# Patient Record
Sex: Male | Born: 1990 | Race: White | Hispanic: No | Marital: Single | Smoking: Never smoker
Health system: Southern US, Community
[De-identification: ages and names within clinical notes are randomized; demographics above are authoritative.]

## PROBLEM LIST (undated history)

## (undated) HISTORY — PX: HERNIA REPAIR: SHX51

---

## 2019-03-05 ENCOUNTER — Emergency Department (HOSPITAL_COMMUNITY): Payer: 59

## 2019-03-05 ENCOUNTER — Emergency Department (HOSPITAL_COMMUNITY)
Admission: EM | Admit: 2019-03-05 | Discharge: 2019-03-05 | Disposition: A | Discharge status: Home / Self Care | Payer: 59 | Attending: Emergency Medicine | Admitting: Emergency Medicine

## 2019-03-05 ENCOUNTER — Other Ambulatory Visit: Payer: Self-pay

## 2019-03-05 ENCOUNTER — Encounter (HOSPITAL_COMMUNITY): Payer: Self-pay | Admitting: Emergency Medicine

## 2019-03-05 DIAGNOSIS — R42 Dizziness and giddiness: Secondary | ICD-10-CM | POA: Diagnosis present

## 2019-03-05 DIAGNOSIS — G43909 Migraine, unspecified, not intractable, without status migrainosus: Secondary | ICD-10-CM | POA: Insufficient documentation

## 2019-03-05 LAB — URINALYSIS, ROUTINE W REFLEX MICROSCOPIC
Bacteria, UA: NONE SEEN
Bilirubin Urine: NEGATIVE
Glucose, UA: NEGATIVE mg/dL
Ketones, ur: 5 mg/dL — AB
Leukocytes,Ua: NEGATIVE
Nitrite: NEGATIVE
Protein, ur: NEGATIVE mg/dL
Specific Gravity, Urine: 1.01 (ref 1.005–1.030)
pH: 6 (ref 5.0–8.0)

## 2019-03-05 LAB — CBC
HCT: 46.8 % (ref 39.0–52.0)
Hemoglobin: 15.5 g/dL (ref 13.0–17.0)
MCH: 29.7 pg (ref 26.0–34.0)
MCHC: 33.1 g/dL (ref 30.0–36.0)
MCV: 89.7 fL (ref 80.0–100.0)
Platelets: 368 10*3/uL (ref 150–400)
RBC: 5.22 MIL/uL (ref 4.22–5.81)
RDW: 12.2 % (ref 11.5–15.5)
WBC: 10.2 10*3/uL (ref 4.0–10.5)
nRBC: 0 % (ref 0.0–0.2)

## 2019-03-05 LAB — BASIC METABOLIC PANEL
Anion gap: 9 (ref 5–15)
BUN: 15 mg/dL (ref 6–20)
CO2: 28 mmol/L (ref 22–32)
Calcium: 9.9 mg/dL (ref 8.9–10.3)
Chloride: 102 mmol/L (ref 98–111)
Creatinine, Ser: 0.76 mg/dL (ref 0.61–1.24)
GFR calc Af Amer: >60 mL/min (ref >60)
GFR calc non Af Amer: >60 mL/min (ref >60)
Glucose, Bld: 126 mg/dL — ABNORMAL HIGH (ref 70–99)
Potassium: 3.9 mmol/L (ref 3.5–5.1)
Sodium: 139 mmol/L (ref 135–145)

## 2019-03-05 MED ORDER — METOCLOPRAMIDE HCL 5 MG/ML IJ SOLN
10.0000 mg | Freq: Once | INTRAMUSCULAR | Status: AC
  Administered 2019-03-05: 10 mg via INTRAMUSCULAR
  Filled 2019-03-05: qty 2

## 2019-03-05 MED ORDER — DIPHENHYDRAMINE HCL 25 MG PO CAPS
25.0000 mg | ORAL_CAPSULE | Freq: Once | ORAL | Status: AC
  Administered 2019-03-05: 25 mg via ORAL
  Filled 2019-03-05: qty 1

## 2019-03-05 MED ORDER — KETOROLAC TROMETHAMINE 60 MG/2ML IM SOLN
60.0000 mg | Freq: Once | INTRAMUSCULAR | Status: AC
  Administered 2019-03-05: 60 mg via INTRAMUSCULAR
  Filled 2019-03-05: qty 2

## 2019-03-05 NOTE — ED Notes (Signed)
PT refused to have blood drawn in the waiting area and would rather wait to have blood drawn when he gets a room in the back per phlemotomy.

## 2019-03-05 NOTE — ED Notes (Signed)
Evalee Jefferson NP in with pt at this time

## 2019-03-05 NOTE — ED Provider Notes (Signed)
[facility] EMERGENCY DEPARTMENT Provider Note   CSN: [id number] Arrival date & time: 03/05/19  1208     History   Chief Complaint Chief Complaint  Patient presents with  . Dizziness    HPI Samuel Clay is a 28 y.o. male with a history significant only for migraine headache presenting with atypical migraine which started around 9 AM today.  He describes typically his migraines start with blurred vision, then resolves then develops a moderate bilateral headache.  This morning around 9 he noted that his head felt "like it was in a fish bowl".  He describes his vision seemed like it had to catch up with his head as he turned his head from side to side.  This has improved, but he now has a band of pressure around his head, 2 out of 10 on the pain scale.  Since this occurred he has felt weak generally, denies focal weakness, but had a near syncopal event associated with a brief episode of palpitations.  He called EMS and reports his blood pressure and his CBG were normal.  He still has mild headache and states generally does not feel well.  He denies nausea or vomiting, no abdominal pain, no shortness of breath, cough, fevers, also no dysuria.  He does not feel lightheaded currently.  He has had no treatment prior to arrival.  He last ate breakfast at 6 AM.     The history is provided by the patient.    History reviewed. No pertinent past medical history.  There are no active problems to display for this patient.   Past Surgical History:  Procedure Laterality Date  . HERNIA REPAIR          Home Medications    Prior to Admission medications   Not on File    Family History Family History  Problem Relation Age of Onset  . Heart disease Mother     Social History Social History   Tobacco Use  . Smoking status: Never Smoker  . Smokeless tobacco: Current User    Types: Chew  Substance Use Topics  . Alcohol use: Yes  . Drug use: Never     Allergies   Penicillins   Review of Systems Review of Systems  Constitutional: Negative for chills and fever.  HENT: Negative for congestion and sore throat.   Eyes: Negative.   Respiratory: Negative for chest tightness and shortness of breath.   Cardiovascular: Positive for palpitations. Negative for chest pain.  Gastrointestinal: Negative for abdominal pain, nausea and vomiting.  Genitourinary: Negative.   Musculoskeletal: Negative for arthralgias, joint swelling and neck pain.  Skin: Negative.  Negative for rash and wound.  Neurological: Positive for light-headedness and headaches. Negative for dizziness, weakness and numbness.  Psychiatric/Behavioral: Negative.      Physical Exam Updated Vital Signs BP 120/81 (BP Location: Right Arm)   Pulse 74   Temp 97.9 F (36.6 C) (Temporal)   Resp 16   Ht 5\' 11"  (1.803 m)   Wt 61.2 kg   SpO2 100%   BMI 18.83 kg/m   Physical Exam Vitals signs and nursing note reviewed.  Constitutional:      Appearance: He is well-developed.  HENT:     Head: Normocephalic and atraumatic.  Eyes:     General: No visual field deficit.    Conjunctiva/sclera: Conjunctivae normal.  Neck:     Musculoskeletal: Normal range of motion.  Cardiovascular:     Rate and Rhythm: Normal rate and regular rhythm.  Heart sounds: Normal heart sounds.  Pulmonary:     Effort: Pulmonary effort is normal.     Breath sounds: Normal breath sounds. No wheezing.  Abdominal:     General: Bowel sounds are normal.     Palpations: Abdomen is soft.     Tenderness: There is no abdominal tenderness.  Musculoskeletal: Normal range of motion.  Skin:    General: Skin is warm and dry.  Neurological:     General: No focal deficit present.     Mental Status: He is alert and oriented to person, place, and time.     Cranial Nerves: Cranial nerves are intact. No cranial nerve deficit or dysarthria.     Coordination: Coordination normal. Heel to [facility] Test normal. Rapid alternating movements normal.      Gait: Gait normal.     Deep Tendon Reflexes:     Reflex Scores:      Bicep reflexes are 2+ on the right side and 2+ on the left side.      Patellar reflexes are 2+ on the right side and 2+ on the left side.     ED Treatments / Results  Labs (all labs ordered are listed, but only abnormal results are displayed) Labs Reviewed  BASIC METABOLIC PANEL  CBC  URINALYSIS, ROUTINE W REFLEX MICROSCOPIC    EKG None  Radiology Dg Chest 2 View  Result Date: 03/05/2019 CLINICAL DATA:  Chest pain EXAM: CHEST - 2 VIEW COMPARISON:  None. FINDINGS: Lungs are clear. Heart size and pulmonary vascularity are normal. No adenopathy. There is mild upper thoracic dextroscoliosis. No pneumothorax. IMPRESSION: No edema or consolidation. Electronically Signed   By: Bretta Bang III M.D.   On: 03/05/2019 13:03   Ct Head Wo Contrast  Result Date: 03/05/2019 CLINICAL DATA:  Atypical migraine with near syncope EXAM: CT HEAD WITHOUT CONTRAST TECHNIQUE: Contiguous axial images were obtained from the base of the skull through the vertex without intravenous contrast. COMPARISON:  None. FINDINGS: Brain: No evidence of acute infarction, hemorrhage, hydrocephalus, extra-axial collection or mass lesion/mass effect. Vascular: No hyperdense vessel or unexpected calcification. Skull: Normal. Negative for fracture or focal lesion. Sinuses/Orbits: No acute finding. Other: None. IMPRESSION: No acute intracranial pathology. No non-contrast CT findings to explain headache. Electronically Signed   By: Lauralyn Primes M.D.   On: 03/05/2019 17:00    Procedures Procedures (including critical care time)  Medications Ordered in ED Medications - No data to display   Initial Impression / Assessment and Plan / ED Course  I have reviewed the triage vital signs and the nursing notes.  Pertinent labs & imaging results that were available during my care of the patient were reviewed by me and considered in my medical decision  making (see chart for details).        Pt with mild headache preceeded by visual disturbance, followed by near syncopal event and fleeting episode of palpations.  Normal exam including neuro exam without focal deficit.  Labs pending, CT imaging, orthostatic VS.   Possible atypical migraine.    Discussed with Pauline Aus, PA-C who assumes pt care.   Final Clinical Impressions(s) / ED Diagnoses   Final diagnoses:  None    ED Discharge Orders    None       Victoriano Lain 03/05/19 1721    Glynn Octave, MD 03/05/19 2345

## 2019-03-05 NOTE — ED Provider Notes (Signed)
   Patient signed out to me by Evalee Jefferson, PA-C at end of shift.  Patient is 28 year old male who had episode of near syncope earlier today.  He also complained of diffuse bandlike headache and feeling as though he was "in a fish bowl."  No previous cardiac history.  He denies fever, neck pain or stiffness, and shortness of breath.    Labs Reviewed  BASIC METABOLIC PANEL - Abnormal; Notable for the following components:      Result Value   Glucose, Bld 126 (*)    All other components within normal limits  URINALYSIS, ROUTINE W REFLEX MICROSCOPIC - Abnormal; Notable for the following components:   Hgb urine dipstick SMALL (*)    Ketones, ur 5 (*)    All other components within normal limits  CBC       Dg Chest 2 View  Result Date: 03/05/2019 CLINICAL DATA:  Chest pain EXAM: CHEST - 2 VIEW COMPARISON:  None. FINDINGS: Lungs are clear. Heart size and pulmonary vascularity are normal. No adenopathy. There is mild upper thoracic dextroscoliosis. No pneumothorax. IMPRESSION: No edema or consolidation. Electronically Signed   By: Lowella Grip III M.D.   On: 03/05/2019 13:03   Ct Head Wo Contrast  Result Date: 03/05/2019 CLINICAL DATA:  Atypical migraine with near syncope EXAM: CT HEAD WITHOUT CONTRAST TECHNIQUE: Contiguous axial images were obtained from the base of the skull through the vertex without intravenous contrast. COMPARISON:  None. FINDINGS: Brain: No evidence of acute infarction, hemorrhage, hydrocephalus, extra-axial collection or mass lesion/mass effect. Vascular: No hyperdense vessel or unexpected calcification. Skull: Normal. Negative for fracture or focal lesion. Sinuses/Orbits: No acute finding. Other: None. IMPRESSION: No acute intracranial pathology. No non-contrast CT findings to explain headache. Electronically Signed   By: Eddie Candle M.D.   On: 03/05/2019 17:00      Work-up today has been reassuring.  Vital signs reviewed.  No orthostatic hypotension.   Symptoms felt to be related to atypical migraine.  No focal neuro deficits.  No vertiginous symptoms.  No meningeal signs.  Ambulatory with a steady gait.  On recheck, patient has drink oral fluids and was medicated with Toradol Benadryl and Reglan.  He reports feeling better and headache has resolved.  I feel he is appropriate for discharge home.  He agrees to close outpatient follow-up and request referral information for a headache specialist.  I feel this is appropriate.  Referral information provided.  Patient given strict return precautions.   Kem Parkinson, PA-C 03/05/19 1956    Ezequiel Essex, MD 03/05/19 2344

## 2019-03-05 NOTE — Discharge Instructions (Signed)
Drink plenty of fluids.  Follow-up with your primary care provider or return to the ER for any worsening symptoms.  I have also provided for all information for local neurologist that she may follow-up with regarding your headaches.

## 2019-03-05 NOTE — ED Triage Notes (Signed)
Patient states he has been having episodes of "feeling like I'm in a fish bowl" symptoms start after a sharp L sided chest pain. Patient states he has had periods where he felt like he was going to lose consciousness. No cardiac history. ECG done in Triage.

## 2019-03-21 ENCOUNTER — Encounter: Payer: Self-pay | Admitting: Emergency Medicine

## 2019-03-21 ENCOUNTER — Other Ambulatory Visit: Payer: Self-pay

## 2019-03-21 ENCOUNTER — Emergency Department
Admission: EM | Admit: 2019-03-21 | Discharge: 2019-03-21 | Disposition: A | Discharge status: Home / Self Care | Payer: 59 | Attending: Emergency Medicine | Admitting: Emergency Medicine

## 2019-03-21 ENCOUNTER — Emergency Department: Payer: 59

## 2019-03-21 DIAGNOSIS — R079 Chest pain, unspecified: Secondary | ICD-10-CM | POA: Insufficient documentation

## 2019-03-21 DIAGNOSIS — R0602 Shortness of breath: Secondary | ICD-10-CM | POA: Insufficient documentation

## 2019-03-21 DIAGNOSIS — F17220 Nicotine dependence, chewing tobacco, uncomplicated: Secondary | ICD-10-CM | POA: Diagnosis not present

## 2019-03-21 LAB — CBC
HCT: 44.7 % (ref 39.0–52.0)
Hemoglobin: 15 g/dL (ref 13.0–17.0)
MCH: 29.4 pg (ref 26.0–34.0)
MCHC: 33.6 g/dL (ref 30.0–36.0)
MCV: 87.5 fL (ref 80.0–100.0)
Platelets: 348 10*3/uL (ref 150–400)
RBC: 5.11 MIL/uL (ref 4.22–5.81)
RDW: 12.2 % (ref 11.5–15.5)
WBC: 6.3 10*3/uL (ref 4.0–10.5)
nRBC: 0 % (ref 0.0–0.2)

## 2019-03-21 LAB — TROPONIN I (HIGH SENSITIVITY)
Troponin I (High Sensitivity): 4 ng/L (ref <18)
Troponin I (High Sensitivity): <2 ng/L (ref <18)

## 2019-03-21 LAB — BASIC METABOLIC PANEL
Anion gap: 9 (ref 5–15)
BUN: 13 mg/dL (ref 6–20)
CO2: 27 mmol/L (ref 22–32)
Calcium: 9.8 mg/dL (ref 8.9–10.3)
Chloride: 102 mmol/L (ref 98–111)
Creatinine, Ser: 0.86 mg/dL (ref 0.61–1.24)
GFR calc Af Amer: >60 mL/min (ref >60)
GFR calc non Af Amer: >60 mL/min (ref >60)
Glucose, Bld: 91 mg/dL (ref 70–99)
Potassium: 4.2 mmol/L (ref 3.5–5.1)
Sodium: 138 mmol/L (ref 135–145)

## 2019-03-21 NOTE — ED Triage Notes (Signed)
Patient to ER for c/o chest pain. Patient reports being seen 2 weeks ago for chest pain and near syncope.

## 2019-03-21 NOTE — ED Provider Notes (Signed)
[facility] Emergency Department Provider Note  ____________________________________________   First MD Initiated Contact with Patient 03/21/19 [phone]     (approximate)  I have reviewed the triage vital signs and the nursing notes.   HISTORY  Chief Complaint Chest Pain    HPI Samuel Clay is a 28 y.o. male otherwise healthy who presents with chest pain.  Patient states he had chest pain that started this morning around 6:30 AM.  It is a sharp stabbing sensation in the middle of his chest, moderate, intermittent, nothing brings it on, nonexertional in nature, nothing makes it worse.  He endorses a little bit of shortness of breath with the chest pain comes on but then resolves. No leg swelling, prior clots, leg swelling, recent travel.  Denies any cough, fevers.  Denies any syncope.  Was seen here few weeks ago and had a near syncopal episode with a headache.  Denies any family history of cardiac issues.  Currently patient does not have any pain.  He states that he has had some intermittent pain in his chest for the past 1 week.    History reviewed. No pertinent past medical history.  There are no active problems to display for this patient.   Past Surgical History:  Procedure Laterality Date   HERNIA REPAIR      Prior to Admission medications   Not on File    Allergies Penicillins  Family History  Problem Relation Age of Onset   Heart disease Mother     Social History Social History   Tobacco Use   Smoking status: Never Smoker   Smokeless tobacco: Current User    Types: Chew  Substance Use Topics   Alcohol use: Yes   Drug use: Never      Review of Systems Constitutional: No fever/chills Eyes: No visual changes. ENT: No sore throat. Cardiovascular: Positive chest pain Respiratory: positive SOB Gastrointestinal: No abdominal pain.  No nausea, no vomiting.  No diarrhea.  No constipation. Genitourinary: Negative for  dysuria. Musculoskeletal: Negative for back pain. Skin: Negative for rash. Neurological: Negative for headaches, focal weakness or numbness. All other ROS negative ____________________________________________   PHYSICAL EXAM:  VITAL SIGNS: ED Triage Vitals  Enc Vitals Group     BP 03/21/19 0829 110/65     Pulse Rate 03/21/19 0829 78     Resp 03/21/19 0829 20     Temp 03/21/19 0829 98 F (36.7 C)     Temp Source 03/21/19 0829 Oral     SpO2 03/21/19 0829 100 %     Weight 03/21/19 0830 134 lb 14.7 oz (61.2 kg)     Height 03/21/19 0830 5\' 11"  (1.803 m)     Head Circumference --      Peak Flow --      Pain Score 03/21/19 0829 2     Pain Loc --      Pain Edu? --      Excl. in GC? --     Constitutional: Alert and oriented. Well appearing and in no acute distress. Eyes: Conjunctivae are normal. EOMI. Head: Atraumatic. Nose: No congestion/rhinnorhea. Mouth/Throat: Mucous membranes are moist.   Neck: No stridor. Trachea Midline. FROM Cardiovascular: Normal rate, regular rhythm. Grossly normal heart sounds.  Good peripheral circulation.  No rash.  No chest wall tenderness. Respiratory: Normal respiratory effort.  No retractions. Lungs CTAB. Gastrointestinal: Soft and nontender. No distention. No abdominal bruits.  Musculoskeletal: No lower extremity tenderness nor edema.  No joint effusions. Neurologic:  Normal speech and language. No gross focal neurologic deficits are appreciated.  Skin:  Skin is warm, dry and intact. No rash noted. Psychiatric: Mood and affect are normal. Speech and behavior are normal. GU: Deferred   ____________________________________________   LABS (all labs ordered are listed, but only abnormal results are displayed)  Labs Reviewed  BASIC METABOLIC PANEL  CBC  TROPONIN I (HIGH SENSITIVITY)  TROPONIN I (HIGH SENSITIVITY)   ____________________________________________   ED ECG REPORT I, Vanessa Riverlea, the attending physician, personally viewed  and interpreted this ECG.  Normal sinus rate of 83, no ST elevation, T wave inversion in V2, P wave does look enlarged which could be secondary to atrial enlargement.  I reviewed his prior EKG when he was here on 11/18 and it looks similar to that except for the T wave inversion that is new. ____________________________________________  RADIOLOGY I, Vanessa , personally viewed and evaluated these images (plain radiographs) as part of my medical decision making, as well as reviewing the written report by the radiologist.  ED MD interpretation: No pneumonia or pneumothorax  Official radiology report(s): Dg Chest 2 View  Result Date: 03/21/2019 CLINICAL DATA:  Left upper chest pain EXAM: CHEST - 2 VIEW COMPARISON:  March 05, 2019 FINDINGS: There is no focal infiltrate, pulmonary edema, or pleural effusion. Mediastinal contour and cardiac silhouette are normal. There is scoliosis of spine. IMPRESSION: No active cardiopulmonary disease. Electronically Signed   By: Abelardo Diesel M.D.   On: 03/21/2019 08:57    ____________________________________________   PROCEDURES  Procedure(s) performed (including Critical Care):  Procedures   ____________________________________________   INITIAL IMPRESSION / ASSESSMENT AND PLAN / ED COURSE   Samuel Clay was evaluated in Emergency Department on 03/21/2019 for the symptoms described in the history of present illness. He was evaluated in the context of the global COVID-19 pandemic, which necessitated consideration that the patient might be at risk for infection with the SARS-CoV-2 virus that causes COVID-19. Institutional protocols and algorithms that pertain to the evaluation of patients at risk for COVID-19 are in a state of rapid change based on information released by regulatory bodies including the CDC and federal and state organizations. These policies and algorithms were followed during the patient's care in the ED.    Most Likely DDx:    -MSK (atypical chest pain) but will get cardiac markers to evaluate for ACS given risk factors/age   DDx that was also considered d/t potential to cause harm, but was found less likely based on history and physical (as detailed above): -PNA (no fevers, cough but CXR to evaluate) -PNX (reassured with equal b/l breath sounds, CXR to evaluate) -Symptomatic anemia (will get H&H) -Pulmonary embolism as no sob at rest, not pleuritic in nature, no hypoxia PERC NEGATIVE -Aortic Dissection as no tearing pain and no radiation to the mid back, pulses equal -Pericarditis no rub on exam, EKG changes or hx to suggest dx -Tamponade (no notable SOB, tachycardic, hypotensive) -Esophageal rupture (no h/o diffuse vomitting/no crepitus)   Work-up was reassuring with no evidence of anemia, no white count elevation to suggest infection.  Chest x-ray was negative.  Initial troponin was negative however given symptoms for less than 3 hours will get repeat troponin anticipate discharge home.  Instructed patient to follow-up with primary care doctor.  Patient states he has been having a hard time getting into primary care doctors.  Given his abnormal EKG with enlarged P waves we will also have him follow-up with cardiology to  consider echocardiogram if he is unable to get a primary care doctor visit.  Patient instructed to come back in if he does have loss of consciousness, worsening pain or any other concerns.  Repeat cardiac markers negative.  Will discharge patient home     ____________________________________________   FINAL CLINICAL IMPRESSION(S) / ED DIAGNOSES   Final diagnoses:  Chest pain, unspecified type     MEDICATIONS GIVEN DURING THIS VISIT:  Medications - No data to display   ED Discharge Orders    None       Note:  This document was prepared using Dragon voice recognition software and may include unintentional dictation errors.   Concha Se, MD 03/21/19 [phone]

## 2019-03-21 NOTE — Discharge Instructions (Addendum)
I discussed with another provider who says he can call the [facility] and make sure they are covered by your insurance but get an ER follow up.  Also, Dr. Clayborn Bigness is the cardiologist at [facility] to consider echo.  Return to the ER for passing out, worsening pain or any other concerns.

## 2020-04-11 IMAGING — DX DG CHEST 2V
2 series · 2 of 2 positions shown · non-contrast
Comparison: None.

CLINICAL DATA: Chest pain

EXAM:
CHEST - 2 VIEW

[chest pa]
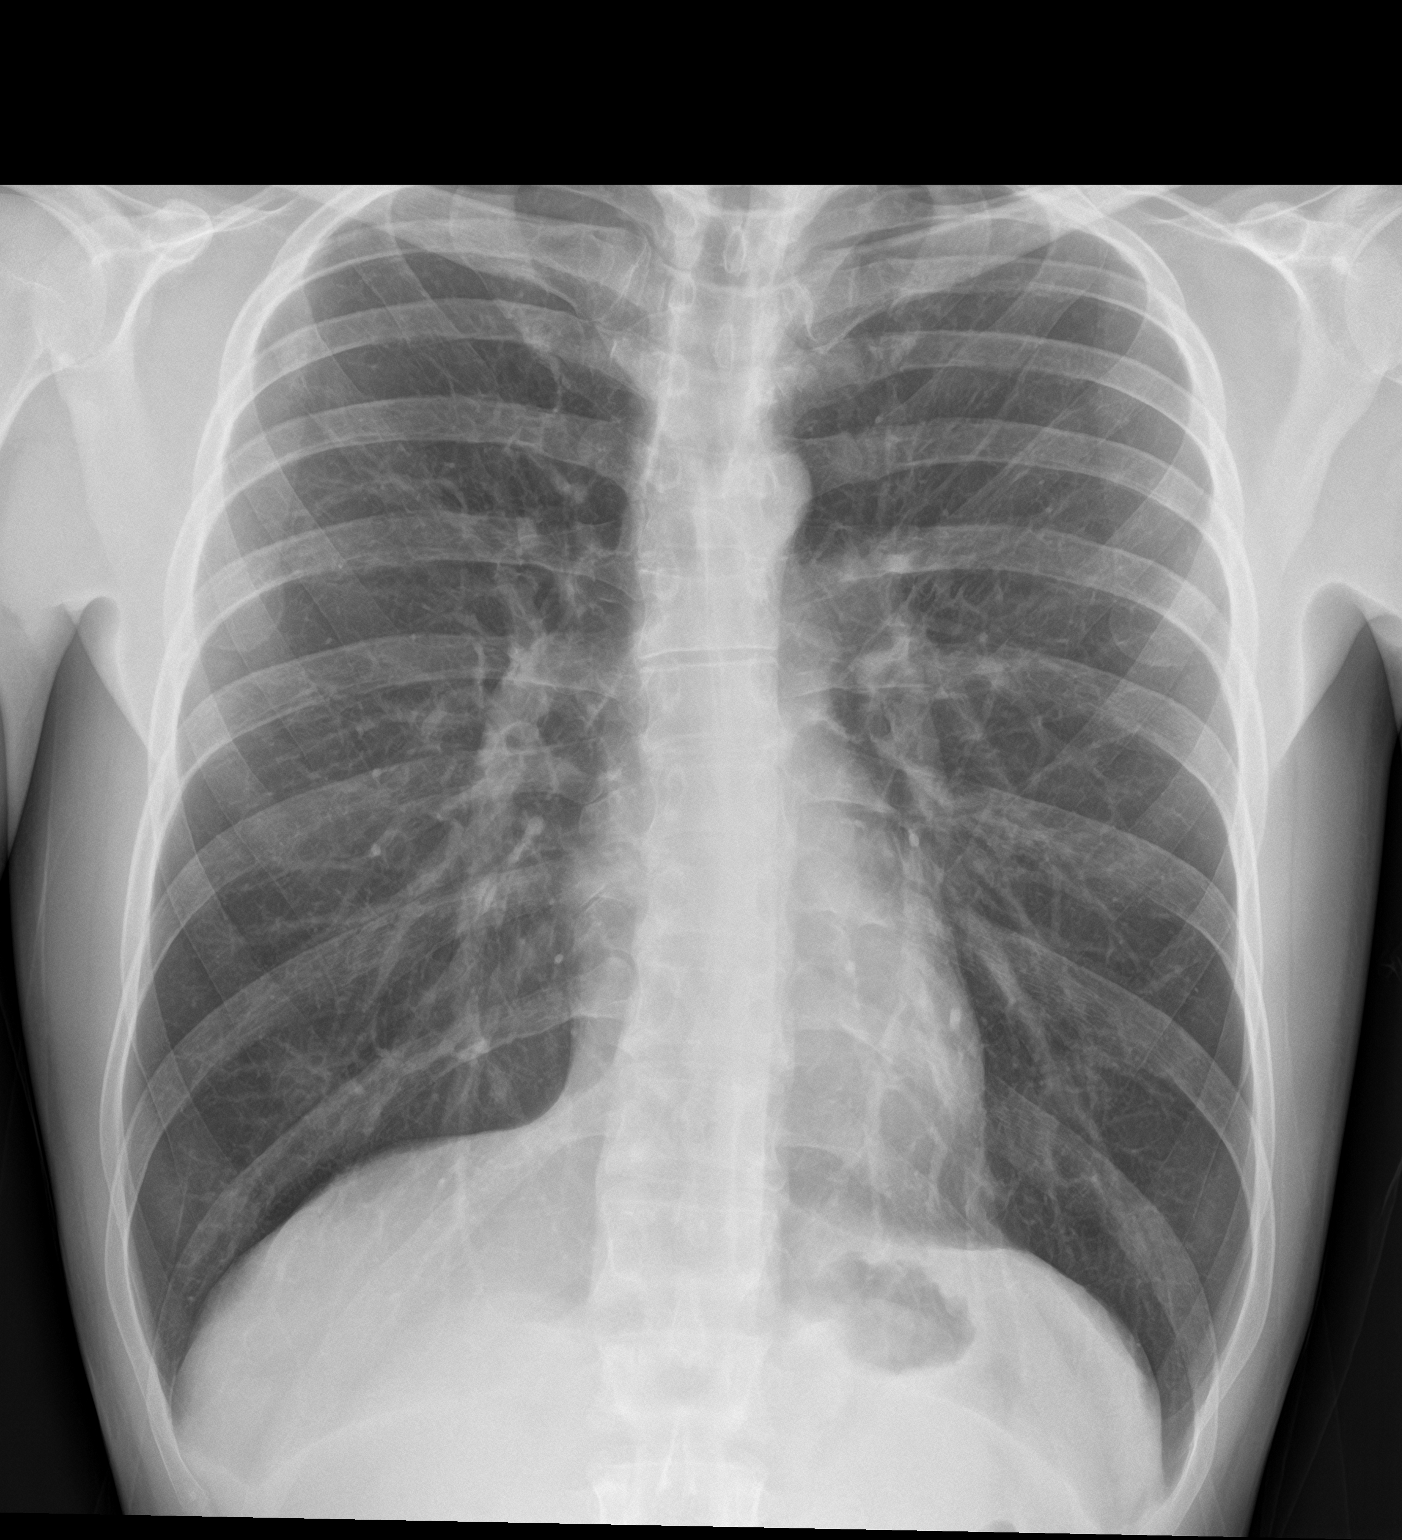

[chest lat]
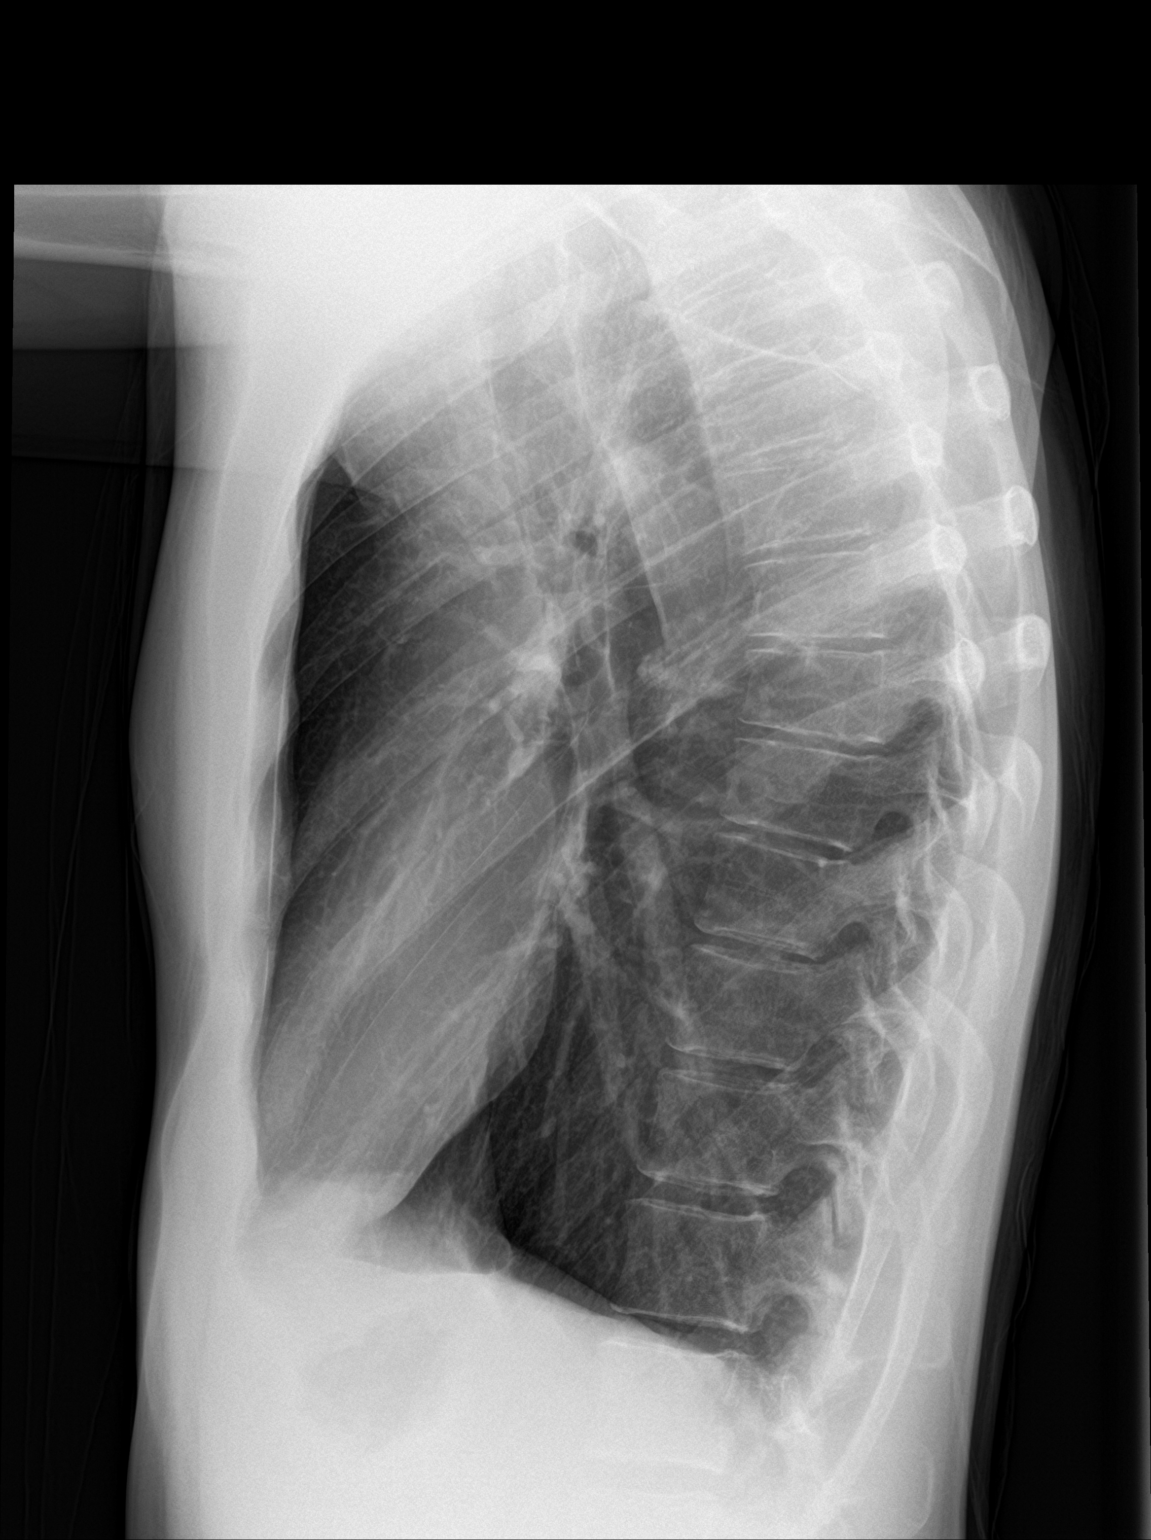

[2 of 2 positions shown; findings below may reference images not displayed]

FINDINGS: Lungs are clear. Heart size and pulmonary vascularity are normal. No
adenopathy. There is mild upper thoracic dextroscoliosis. No
pneumothorax.
IMPRESSION: No edema or consolidation.
